# Patient Record
Sex: Female | Born: 1955
Health system: Southern US, Community
[De-identification: ages and names within clinical notes are randomized; demographics above are authoritative.]

## PROBLEM LIST (undated history)

## (undated) DIAGNOSIS — I10 Essential (primary) hypertension: Secondary | ICD-10-CM

## (undated) DIAGNOSIS — I119 Hypertensive heart disease without heart failure: Secondary | ICD-10-CM

## (undated) DIAGNOSIS — F419 Anxiety disorder, unspecified: Secondary | ICD-10-CM

## (undated) DIAGNOSIS — J410 Simple chronic bronchitis: Secondary | ICD-10-CM

## (undated) DIAGNOSIS — E785 Hyperlipidemia, unspecified: Secondary | ICD-10-CM

## (undated) DIAGNOSIS — I5189 Other ill-defined heart diseases: Secondary | ICD-10-CM

## (undated) DIAGNOSIS — R011 Cardiac murmur, unspecified: Secondary | ICD-10-CM

## (undated) DIAGNOSIS — E119 Type 2 diabetes mellitus without complications: Secondary | ICD-10-CM

## (undated) DIAGNOSIS — I4510 Unspecified right bundle-branch block: Secondary | ICD-10-CM

## (undated) DIAGNOSIS — I422 Other hypertrophic cardiomyopathy: Secondary | ICD-10-CM

## (undated) HISTORY — PX: TUBAL LIGATION: SHX77

## (undated) HISTORY — PX: CHOLECYSTECTOMY: SHX55

---

## 2008-07-22 ENCOUNTER — Ambulatory Visit: Payer: Self-pay | Admitting: Internal Medicine

## 2009-01-26 ENCOUNTER — Ambulatory Visit: Payer: Self-pay | Admitting: Internal Medicine

## 2009-06-11 ENCOUNTER — Ambulatory Visit: Payer: Self-pay | Admitting: Internal Medicine

## 2009-11-25 ENCOUNTER — Ambulatory Visit: Payer: Self-pay | Admitting: Family Medicine

## 2012-01-15 ENCOUNTER — Ambulatory Visit: Payer: Self-pay | Admitting: Family Medicine

## 2015-05-03 ENCOUNTER — Other Ambulatory Visit: Payer: Self-pay | Admitting: Family Medicine

## 2018-02-18 DIAGNOSIS — L089 Local infection of the skin and subcutaneous tissue, unspecified: Secondary | ICD-10-CM | POA: Diagnosis not present

## 2018-02-18 DIAGNOSIS — I8312 Varicose veins of left lower extremity with inflammation: Secondary | ICD-10-CM | POA: Diagnosis not present

## 2018-09-16 DIAGNOSIS — B354 Tinea corporis: Secondary | ICD-10-CM | POA: Diagnosis not present

## 2018-09-16 DIAGNOSIS — B351 Tinea unguium: Secondary | ICD-10-CM | POA: Diagnosis not present

## 2018-09-16 DIAGNOSIS — Z79899 Other long term (current) drug therapy: Secondary | ICD-10-CM | POA: Diagnosis not present

## 2018-09-17 DIAGNOSIS — M722 Plantar fascial fibromatosis: Secondary | ICD-10-CM | POA: Diagnosis not present

## 2019-02-13 DIAGNOSIS — R21 Rash and other nonspecific skin eruption: Secondary | ICD-10-CM | POA: Diagnosis not present

## 2019-02-13 DIAGNOSIS — I8311 Varicose veins of right lower extremity with inflammation: Secondary | ICD-10-CM | POA: Diagnosis not present

## 2019-03-22 ENCOUNTER — Encounter: Payer: Self-pay | Admitting: Gynecology

## 2019-03-22 ENCOUNTER — Ambulatory Visit
Admission: EM | Admit: 2019-03-22 | Discharge: 2019-03-22 | Disposition: A | Discharge status: Home / Self Care | Payer: 59 | Attending: Family Medicine | Admitting: Family Medicine

## 2019-03-22 ENCOUNTER — Other Ambulatory Visit: Payer: Self-pay

## 2019-03-22 ENCOUNTER — Ambulatory Visit (INDEPENDENT_AMBULATORY_CARE_PROVIDER_SITE_OTHER): Payer: 59

## 2019-03-22 DIAGNOSIS — M1712 Unilateral primary osteoarthritis, left knee: Secondary | ICD-10-CM | POA: Diagnosis not present

## 2019-03-22 DIAGNOSIS — M25562 Pain in left knee: Secondary | ICD-10-CM

## 2019-03-22 HISTORY — DX: Hyperlipidemia, unspecified: E78.5

## 2019-03-22 HISTORY — DX: Type 2 diabetes mellitus without complications: E11.9

## 2019-03-22 MED ORDER — MELOXICAM 15 MG PO TABS: 15.0000 mg | ORAL_TABLET | Freq: Every day | ORAL | 0 refills | Status: DC | PRN

## 2019-03-22 NOTE — ED Provider Notes (Signed)
[facility]    CSN: [id number] Arrival date & time: 03/22/19  1154  History   Chief Complaint Knee pain  HPI  63 year old female presents with left knee pain.  Patient reports a 2-week history of left knee pain.  No known inciting event.  Has been progressively getting worse.  Patient states that she fell on Friday after she tripped on a chair and fell onto gravel.  She states that her pain has been worse since then.  Pain currently 3/10 in severity.  Worse with ambulation and certain movements.  He has been icing and using heat without resolution.  No relieving factors.  Pain is located anteriorly as well as posteriorly.  She reports associated swelling.  No other associated symptoms.  No other complaints.  PMH, Surgical Hx, Family Hx, Social History reviewed and updated as below.  Past Medical History:  Diagnosis Date  . Diabetes mellitus without complication (HCC)   . Hyperlipemia   Obesity  Past Surgical History:  Procedure Laterality Date  . CHOLECYSTECTOMY    . TUBAL LIGATION      OB History   No obstetric history on file.    Home Medications    Prior to Admission medications   Medication Sig Start Date End Date Taking? Authorizing Provider  atorvastatin (LIPITOR) 40 MG tablet Take by mouth. 11/11/17  Yes [provider]  cetirizine (ZYRTEC) 10 MG tablet Take by mouth.   Yes [provider]  glucose blood (ONE TOUCH ULTRA TEST) test strip  09/13/14  Yes [provider]  metFORMIN (GLUCOPHAGE) 1000 MG tablet Take by mouth. 11/11/17  Yes [provider]  OneTouch Delica Lancets 33G MISC  09/13/14  Yes [provider]  aspirin 81 MG chewable tablet Chew by mouth. 06/02/10   [provider]  meloxicam (MOBIC) 15 MG tablet Take 1 tablet (15 mg total) by mouth daily as needed for pain. 03/22/19   Tommie Sams, DO    Family History Family History  Problem Relation Age of Onset  . Bone cancer Mother   .  Hypertension Father   . Gout Father     Social History Social History   Tobacco Use  . Smoking status: Current Every Day Smoker  . Smokeless tobacco: Never Used  Substance Use Topics  . Alcohol use: Yes  . Drug use: Never     Allergies   Aspartame; Codeine; and Other   Review of Systems Review of Systems  Constitutional: Negative.   Musculoskeletal:       Left knee pain.   Physical Exam Triage Vital Signs ED Triage Vitals  Enc Vitals Group     BP 03/22/19 1208 127/89     Pulse Rate 03/22/19 1208 90     Resp 03/22/19 1208 16     Temp 03/22/19 1208 98.7 F (37.1 C)     Temp Source 03/22/19 1208 Oral     SpO2 03/22/19 1208 98 %     Weight 03/22/19 1209 195 lb (88.5 kg)     Height 03/22/19 1209 5\' 2"  (1.575 m)     Head Circumference --      Peak Flow --      Pain Score 03/22/19 1209 3     Pain Loc --      Pain Edu? --      Excl. in GC? --    No data found.  Updated Vital Signs BP 127/89 (BP Location: Left Arm)   Pulse 90  Temp 98.7 F (37.1 C) (Oral)   Resp 16   Ht 5\' 2"  (1.575 m)   Wt 88.5 kg   SpO2 98%   BMI 35.67 kg/m   Visual Acuity Right Eye Distance:   Left Eye Distance:   Bilateral Distance:    Right Eye Near:   Left Eye Near:    Bilateral Near:     Physical Exam Vitals signs and nursing note reviewed.  Constitutional:      General: She is not in acute distress.    Appearance: Normal appearance. She is obese.  HENT:     Head: Normocephalic and atraumatic.  Eyes:     General:        Right eye: No discharge.        Left eye: No discharge.     Conjunctiva/sclera: Conjunctivae normal.  Cardiovascular:     Rate and Rhythm: Normal rate and regular rhythm.  Pulmonary:     Effort: Pulmonary effort is normal.     Breath sounds: Normal breath sounds.  Musculoskeletal:     Comments: Left knee  Inspection -swelling noted. Mild tenderness anteriorly. Range of motion decrease in extension. Ligaments intact. Patellar and quadriceps  tendons unremarkable.  Neurological:     Mental Status: She is alert.  Psychiatric:        Mood and Affect: Mood normal.        Behavior: Behavior normal.    UC Treatments / Results  Labs (all labs ordered are listed, but only abnormal results are displayed) Labs Reviewed - No data to display  EKG None  Radiology Dg Knee Complete 4 Views Left  Result Date: 03/22/2019 CLINICAL DATA:  Left knee pain for the past 2 weeks. No known injury. EXAM: LEFT KNEE - COMPLETE 4+ VIEW COMPARISON:  None. FINDINGS: Suspected small knee joint effusion. No fracture or dislocation. Mild tricompartmental degenerative change of the knee, likely worse within the medial compartment and patellar femoral joints with joint space loss, subchondral sclerosis and osteophytosis. No evidence of chondrocalcinosis. Presumed dermal calcification adjacent to the tibial tuberosity. Regional soft tissues appear normal. IMPRESSION: 1. No acute findings. 2. Mild tricompartmental degenerative change of the knee with associated small knee joint effusion. Electronically Signed   By: Simonne ComeJohn  Watts M.D.   On: 03/22/2019 12:48    Procedures Procedures (including critical care time)  Medications Ordered in UC Medications - No data to display  Initial Impression / Assessment and Plan / UC Course  I have reviewed the triage vital signs and the nursing notes.  Pertinent labs & imaging results that were available during my care of the patient were reviewed by me and considered in my medical decision making (see chart for details).    63 year old female presents with knee pain. Xray reveal Osteoarthritis and effusion. No fracture. Advised rest, ice, elevation. Mobic as directed.  Final Clinical Impressions(s) / UC Diagnoses   Final diagnoses:  Primary osteoarthritis of left knee  Acute pain of left knee     Discharge Instructions     Rest, ice, elevation.  Medication as prescribed.  If persists, see [facility] or [facility].  Take care  Dr. Adriana Simasook     ED Prescriptions    Medication Sig Dispense Auth. Provider   meloxicam (MOBIC) 15 MG tablet Take 1 tablet (15 mg total) by mouth daily as needed for pain. 30 tablet Tommie Samsook, Kaliya Shreiner G, DO     Controlled Substance Prescriptions [place] Controlled Substance Registry  consulted? Not Applicable   Tommie Sams, DO 03/22/19 1302

## 2019-03-22 NOTE — ED Triage Notes (Signed)
Pt. Stated x 2 weeks with left knee pain. Per patient pain getting worse. Per patient x 3 days trip and fell on left knee,

## 2019-03-22 NOTE — Discharge Instructions (Signed)
Rest, ice, elevation.  Medication as prescribed.  If persists, see [facility] or [facility].  Take care  Dr. Adriana Simas

## 2019-04-11 ENCOUNTER — Other Ambulatory Visit: Payer: Self-pay | Admitting: Family Medicine

## 2020-10-23 IMAGING — CR LEFT KNEE - COMPLETE 4+ VIEW
4 series · 4 of 4 positions shown · non-contrast
Comparison: None.

CLINICAL DATA: Left knee pain for the past 2 weeks. No known
injury.

EXAM:
LEFT KNEE - COMPLETE 4+ VIEW

[knee ap]
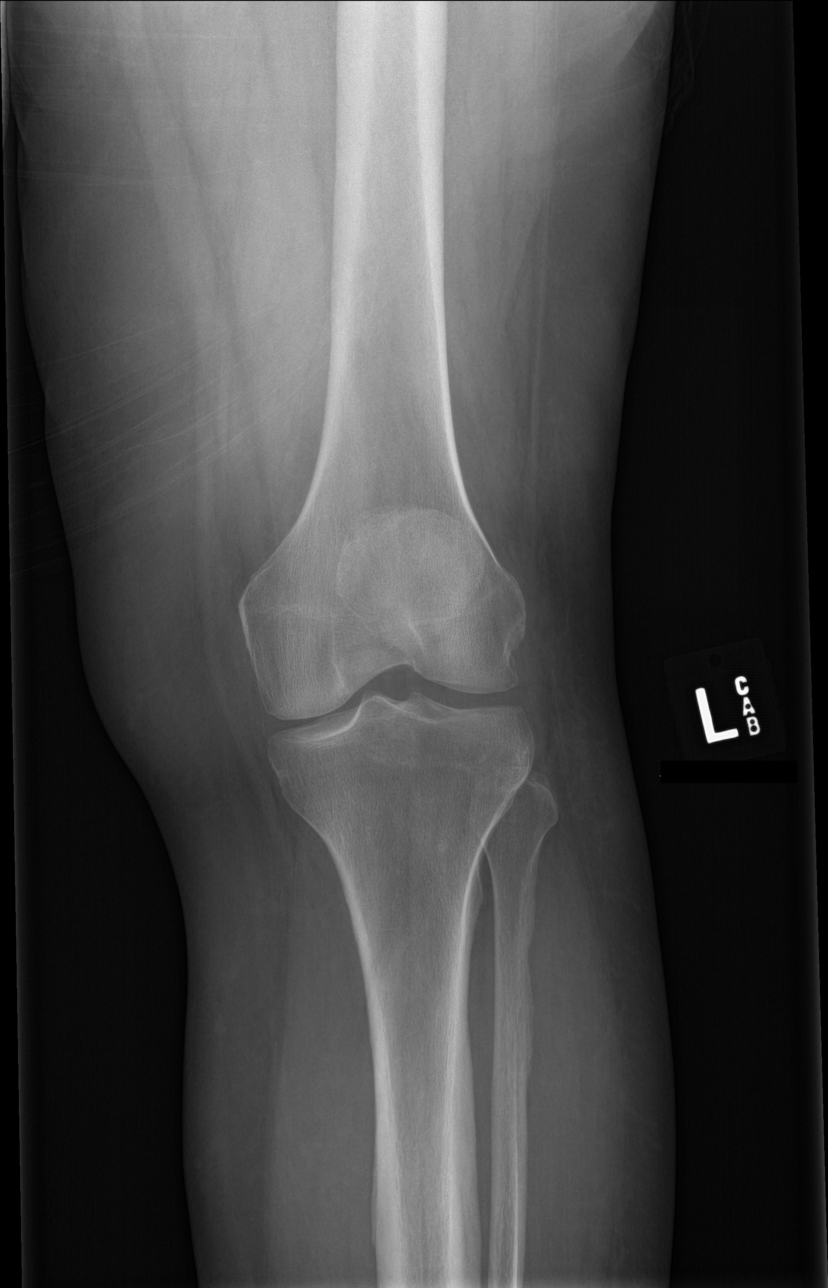

[knee lat]
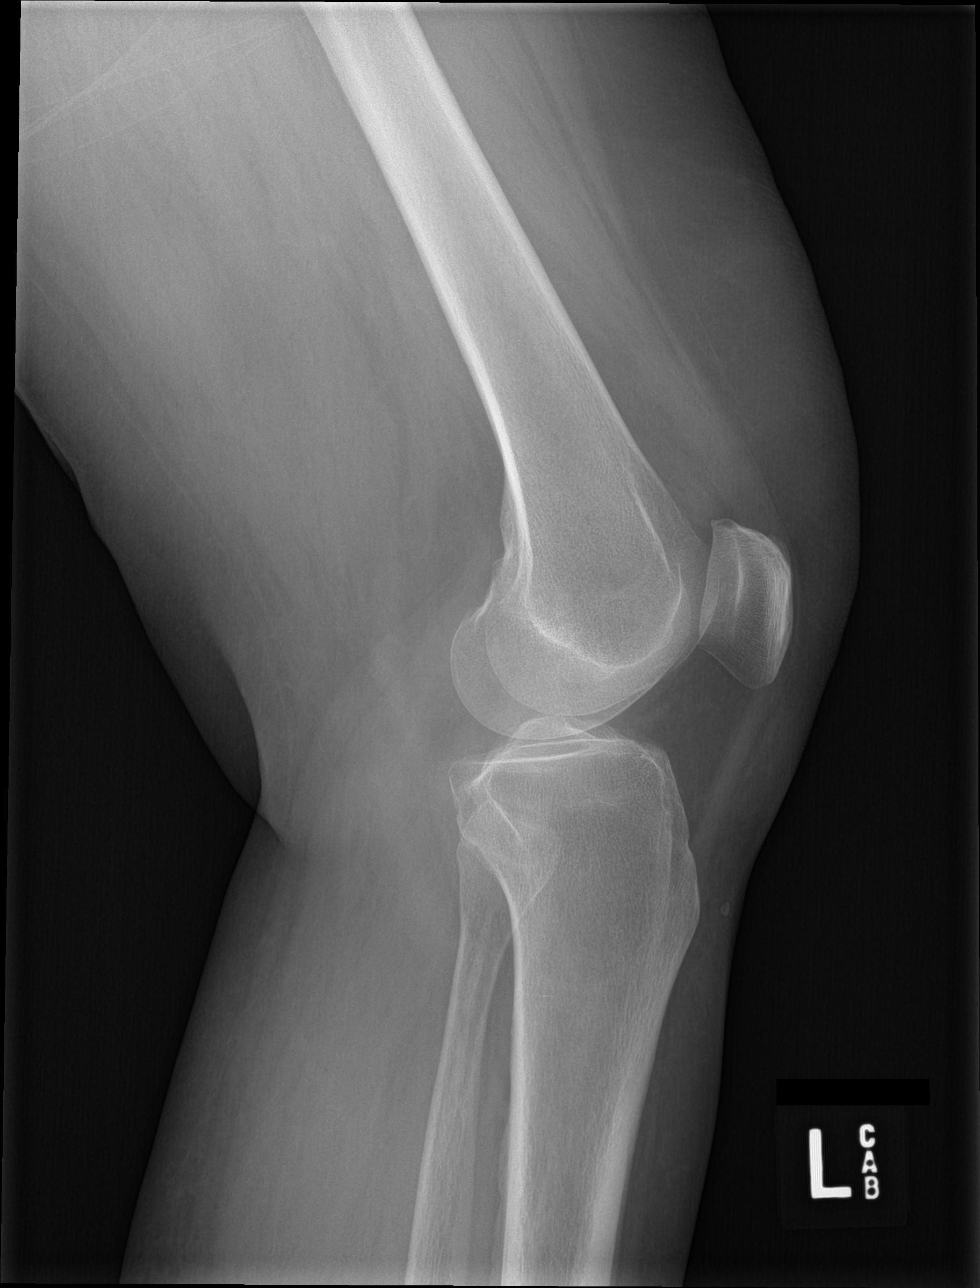

[tunnel]
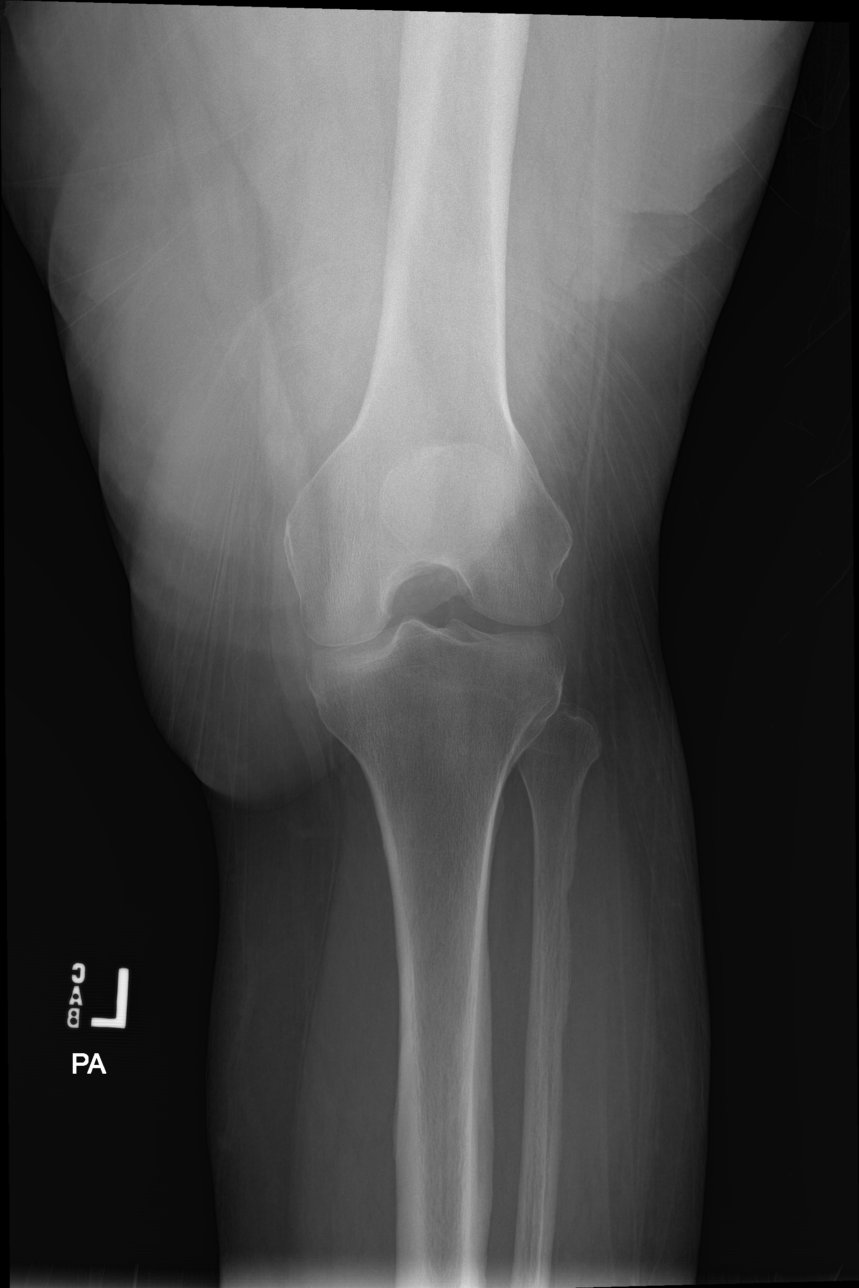

[patella skyline]
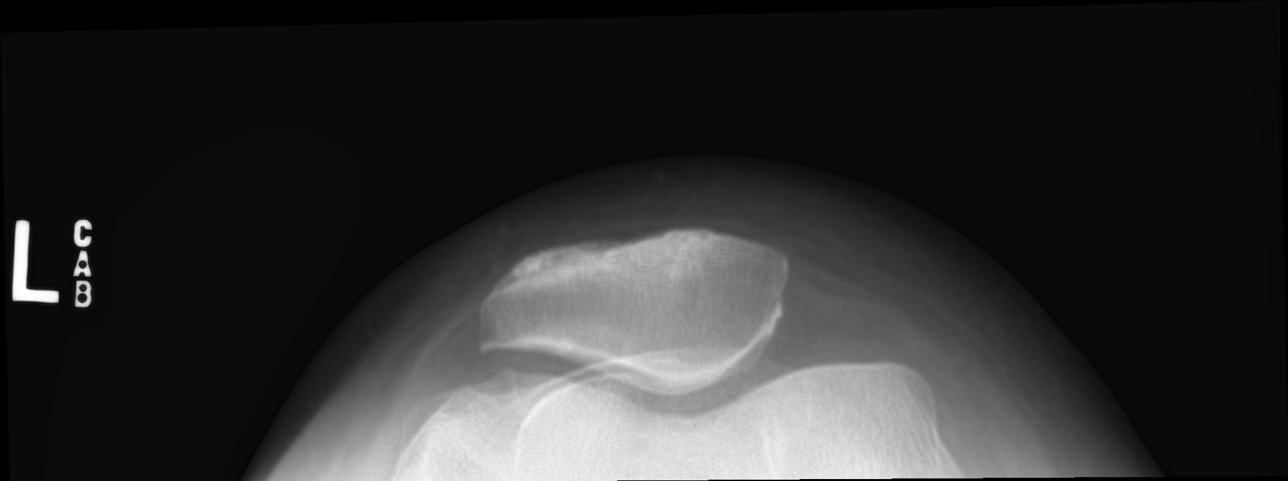

[4 of 4 positions shown; findings below may reference images not displayed]

FINDINGS: Suspected small knee joint effusion. No fracture or dislocation.
Mild tricompartmental degenerative change of the knee, likely worse
within the medial compartment and patellar femoral joints with joint
space loss, subchondral sclerosis and osteophytosis. No evidence of
chondrocalcinosis. Presumed dermal calcification adjacent to the
tibial tuberosity. Regional soft tissues appear normal.
IMPRESSION: 1. No acute findings.
2. Mild tricompartmental degenerative change of the knee with
associated small knee joint effusion.

## 2022-04-17 ENCOUNTER — Other Ambulatory Visit: Payer: Self-pay | Admitting: Internal Medicine

## 2022-04-17 DIAGNOSIS — I10 Essential (primary) hypertension: Secondary | ICD-10-CM

## 2022-04-17 DIAGNOSIS — R9431 Abnormal electrocardiogram [ECG] [EKG]: Secondary | ICD-10-CM

## 2024-01-20 ENCOUNTER — Ambulatory Visit: Payer: Medicare Other

## 2024-01-20 DIAGNOSIS — Z09 Encounter for follow-up examination after completed treatment for conditions other than malignant neoplasm: Secondary | ICD-10-CM | POA: Diagnosis not present

## 2024-01-20 DIAGNOSIS — K640 First degree hemorrhoids: Secondary | ICD-10-CM | POA: Diagnosis not present

## 2024-01-20 DIAGNOSIS — D123 Benign neoplasm of transverse colon: Secondary | ICD-10-CM | POA: Diagnosis not present

## 2024-01-20 DIAGNOSIS — K621 Rectal polyp: Secondary | ICD-10-CM | POA: Diagnosis not present

## 2024-01-20 DIAGNOSIS — D122 Benign neoplasm of ascending colon: Secondary | ICD-10-CM | POA: Diagnosis not present

## 2024-01-20 DIAGNOSIS — Z860101 Personal history of adenomatous and serrated colon polyps: Secondary | ICD-10-CM | POA: Diagnosis not present

## 2024-03-25 ENCOUNTER — Encounter: Payer: Self-pay | Admitting: Ophthalmology

## 2024-03-25 NOTE — Anesthesia Preprocedure Evaluation (Addendum)
 Anesthesia Evaluation    Airway        Dental   Pulmonary Current Smoker          Cardiovascular hypertension,   04-10-22  INTERPRETATION  NORMAL LEFT VENTRICULAR SYSTOLIC FUNCTION   WITH SEVERE LVH  NORMAL RIGHT VENTRICULAR SYSTOLIC FUNCTION  TRIVIAL REGURGITATION NOTED  NO VALVULAR STENOSIS  TRIVIAL MR, TR ,PR  GRADE I DIASTOLIC DYSFUNCTION Hypertrophic cardiomyopathy  EF >55%     Neuro/Psych    GI/Hepatic   Endo/Other  diabetes    Renal/GU      Musculoskeletal   Abdominal   Peds  Hematology   Anesthesia Other Findings Diabetes mellitus without complication Hyperlipemia Heart murmur Simple chronic bronchitis  Primary hypertension  Right bundle branch block (RBBB) LVH (left ventricular hypertrophy) due to hypertensive disease, without heart failure Hypertrophic cardiomyopathy  Grade I diastolic dysfunction     Reproductive/Obstetrics                              Anesthesia Physical Anesthesia Plan  ASA: 4  Anesthesia Plan:    Post-op Pain Management:    Induction:   PONV Risk Score and Plan:   Airway Management Planned:   Additional Equipment:   Intra-op Plan:   Post-operative Plan:   Informed Consent:   Plan Discussed with:   Anesthesia Plan Comments:          Anesthesia Quick Evaluation

## 2024-04-01 NOTE — Discharge Instructions (Signed)

## 2024-04-02 ENCOUNTER — Encounter: Payer: Self-pay | Admitting: Ophthalmology

## 2024-04-02 ENCOUNTER — Ambulatory Visit
Admission: RE | Admit: 2024-04-02 | Discharge: 2024-04-02 | Disposition: A | Discharge status: Home / Self Care | Attending: Ophthalmology | Admitting: Ophthalmology

## 2024-04-02 ENCOUNTER — Other Ambulatory Visit: Payer: Self-pay

## 2024-04-02 ENCOUNTER — Ambulatory Visit: Payer: Self-pay | Admitting: Anesthesiology

## 2024-04-02 ENCOUNTER — Encounter
Admission: RE | Disposition: A | Discharge status: Home / Self Care | Payer: Self-pay | Source: Home / Self Care | Attending: Ophthalmology

## 2024-04-02 DIAGNOSIS — Z7985 Long-term (current) use of injectable non-insulin antidiabetic drugs: Secondary | ICD-10-CM | POA: Insufficient documentation

## 2024-04-02 DIAGNOSIS — I10 Essential (primary) hypertension: Secondary | ICD-10-CM | POA: Insufficient documentation

## 2024-04-02 DIAGNOSIS — H2512 Age-related nuclear cataract, left eye: Secondary | ICD-10-CM | POA: Insufficient documentation

## 2024-04-02 DIAGNOSIS — I4510 Unspecified right bundle-branch block: Secondary | ICD-10-CM | POA: Diagnosis not present

## 2024-04-02 DIAGNOSIS — E1136 Type 2 diabetes mellitus with diabetic cataract: Secondary | ICD-10-CM | POA: Diagnosis not present

## 2024-04-02 DIAGNOSIS — H25042 Posterior subcapsular polar age-related cataract, left eye: Secondary | ICD-10-CM | POA: Diagnosis present

## 2024-04-02 DIAGNOSIS — F17210 Nicotine dependence, cigarettes, uncomplicated: Secondary | ICD-10-CM | POA: Diagnosis not present

## 2024-04-02 HISTORY — DX: Unspecified right bundle-branch block: I45.10

## 2024-04-02 HISTORY — DX: Simple chronic bronchitis: J41.0

## 2024-04-02 HISTORY — DX: Other ill-defined heart diseases: I51.89

## 2024-04-02 HISTORY — DX: Anxiety disorder, unspecified: F41.9

## 2024-04-02 HISTORY — DX: Other hypertrophic cardiomyopathy: I42.2

## 2024-04-02 HISTORY — DX: Hypertensive heart disease without heart failure: I11.9

## 2024-04-02 HISTORY — DX: Cardiac murmur, unspecified: R01.1

## 2024-04-02 HISTORY — PX: CATARACT EXTRACTION W/PHACO: SHX586

## 2024-04-02 HISTORY — DX: Essential (primary) hypertension: I10

## 2024-04-02 LAB — GLUCOSE, CAPILLARY: Glucose-Capillary: 189 mg/dL — ABNORMAL HIGH (ref 70–99)

## 2024-04-02 SURGERY — PHACOEMULSIFICATION, CATARACT, WITH IOL INSERTION
Anesthesia: Topical | Site: Eye | Laterality: Left

## 2024-04-02 MED ORDER — MOXIFLOXACIN HCL 0.5 % OP SOLN
OPHTHALMIC | Status: DC | PRN
  Administered 2024-04-02: .2 mL via OPHTHALMIC

## 2024-04-02 MED ORDER — SIGHTPATH DOSE#1 NA HYALUR & NA CHOND-NA HYALUR IO KIT
PACK | INTRAOCULAR | Status: DC | PRN
  Administered 2024-04-02: 1 via OPHTHALMIC

## 2024-04-02 MED ORDER — TETRACAINE HCL 0.5 % OP SOLN
1.0000 [drp] | OPHTHALMIC | Status: DC | PRN
  Administered 2024-04-02 (×3): 1 [drp] via OPHTHALMIC

## 2024-04-02 MED ORDER — ARMC OPHTHALMIC DILATING DROPS
OPHTHALMIC | Status: AC
  Filled 2024-04-02: qty 0.5

## 2024-04-02 MED ORDER — FENTANYL CITRATE (PF) 100 MCG/2ML IJ SOLN
INTRAMUSCULAR | Status: AC
  Filled 2024-04-02: qty 2

## 2024-04-02 MED ORDER — SIGHTPATH DOSE#1 BSS IO SOLN
INTRAOCULAR | Status: DC | PRN
  Administered 2024-04-02: 77 mL via OPHTHALMIC

## 2024-04-02 MED ORDER — TETRACAINE HCL 0.5 % OP SOLN
OPHTHALMIC | Status: AC
  Filled 2024-04-02: qty 4

## 2024-04-02 MED ORDER — BRIMONIDINE TARTRATE-TIMOLOL 0.2-0.5 % OP SOLN
OPHTHALMIC | Status: DC | PRN
  Administered 2024-04-02: 1 [drp] via OPHTHALMIC

## 2024-04-02 MED ORDER — ARMC OPHTHALMIC DILATING DROPS
1.0000 | OPHTHALMIC | Status: DC | PRN
  Administered 2024-04-02 (×3): 1 via OPHTHALMIC

## 2024-04-02 MED ORDER — FENTANYL CITRATE (PF) 100 MCG/2ML IJ SOLN
INTRAMUSCULAR | Status: DC | PRN
  Administered 2024-04-02: 50 ug via INTRAVENOUS

## 2024-04-02 MED ORDER — LIDOCAINE HCL (PF) 2 % IJ SOLN
INTRAOCULAR | Status: DC | PRN
  Administered 2024-04-02: 4 mL via INTRAOCULAR

## 2024-04-02 MED ORDER — MIDAZOLAM HCL 2 MG/2ML IJ SOLN
INTRAMUSCULAR | Status: AC
  Filled 2024-04-02: qty 2

## 2024-04-02 MED ORDER — MIDAZOLAM HCL 2 MG/2ML IJ SOLN
2.0000 mg | Freq: Once | INTRAMUSCULAR | Status: AC
  Administered 2024-04-02: 2 mg via INTRAVENOUS

## 2024-04-02 SURGICAL SUPPLY — 13 items
CATARACT SUITE SIGHTPATH (MISCELLANEOUS) ×1 IMPLANT
DISSECTOR HYDRO NUCLEUS 50X22 (MISCELLANEOUS) ×1 IMPLANT
DRSG TEGADERM 2-3/8X2-3/4 SM (GAUZE/BANDAGES/DRESSINGS) ×1 IMPLANT
FEE CATARACT SUITE SIGHTPATH (MISCELLANEOUS) ×1 IMPLANT
GLOVE BIOGEL PI IND STRL 8 (GLOVE) ×1 IMPLANT
GLOVE SURG LX STRL 7.5 STRW (GLOVE) ×1 IMPLANT
GLOVE SURG PROTEXIS BL SZ6.5 (GLOVE) ×1 IMPLANT
GLOVE SURG SYN 6.5 PF PI BL (GLOVE) ×1 IMPLANT
LENS CLAREON WAGON WHEEL 22.5 (Intraocular Lens) ×1 IMPLANT
LENS IOL CLRN WGN WHL 22.5 (Intraocular Lens) IMPLANT
NDL FILTER BLUNT 18X1 1/2 (NEEDLE) ×1 IMPLANT
NEEDLE FILTER BLUNT 18X1 1/2 (NEEDLE) ×1 IMPLANT
SYR 3ML LL SCALE MARK (SYRINGE) ×1 IMPLANT

## 2024-04-02 NOTE — H&P (Signed)
 [facility]   Primary Care Physician:  Madaline Scales, PA-C Ophthalmologist: Dr. Meg Spina  Pre-Procedure History & Physical: HPI:  Erica Mccoy is a 68 y.o. female here for cataract surgery.   Past Medical History:  Diagnosis Date   Diabetes mellitus without complication (HCC)    Grade I diastolic dysfunction    Heart murmur    Hyperlipemia    Hypertrophic cardiomyopathy (HCC)    LVH (left ventricular hypertrophy) due to hypertensive disease, without heart failure    Primary hypertension    Right bundle branch block (RBBB)    Simple chronic bronchitis (HCC)     Past Surgical History:  Procedure Laterality Date   CHOLECYSTECTOMY     TUBAL LIGATION      Prior to Admission medications   Medication Sig Start Date End Date Taking? Authorizing Provider  atorvastatin (LIPITOR) 40 MG tablet Take by mouth. 11/11/17  Yes [provider]  cetirizine (ZYRTEC) 10 MG tablet Take by mouth.   Yes [provider]  OVER THE COUNTER MEDICATION daily. [place]   Yes [provider]  tirzepatide [facility]) 15 MG/0.5ML Pen Inject 15 mg into the skin once a week. Saturday   Yes [provider]  aspirin 81 MG chewable tablet Chew by mouth. 06/02/10   [provider]  glucose blood (ONE TOUCH ULTRA TEST) test strip  09/13/14   [provider]  ipratropium (ATROVENT) 0.06 % nasal spray Place 2 sprays into both nostrils 3 (three) times daily. Patient not taking: Reported on 03/25/2024    [provider]  OneTouch Delica Lancets 33G MISC  09/13/14   [provider]  predniSONE (DELTASONE) 20 MG tablet Take 20 mg by mouth in the morning and at bedtime. For 7 days Patient not taking: Reported on 03/25/2024    [provider]    Allergies as of 02/19/2024 - Unable to Assess 03/22/2019  Allergen Reaction Noted   Aspartame Dermatitis and Itching 11/20/2011   Codeine Nausea And Vomiting 03/22/2019   Other Other (See  Comments) 09/20/2014    Family History  Problem Relation Age of Onset   Bone cancer Mother    Hypertension Father    Gout Father     Social History   Socioeconomic History   Marital status: Married    Spouse name: Not on file   Number of children: Not on file   Years of education: Not on file   Highest education level: Not on file  Occupational History   Not on file  Tobacco Use   Smoking status: Every Day    Current packs/day: 0.50    Average packs/day: 0.5 packs/day for 50.3 years (25.1 ttl pk-yrs)    Types: Cigarettes    Start date: 1975   Smokeless tobacco: Never  Vaping Use   Vaping status: Never Used  Substance and Sexual Activity   Alcohol use: Yes   Drug use: Never   Sexual activity: Not on file  Other Topics Concern   Not on file  Social History Narrative   Not on file   Social Drivers of Health   Financial Resource Strain: Low Risk  (04/10/2023)   Received from [facility]   Overall Financial Resource Strain (CARDIA)    Difficulty of Paying Living Expenses: Not very hard  Food Insecurity: No Food Insecurity (04/10/2023)   Received from [facility]   Hunger Vital Sign    Worried About Running Out of Food in  the Last Year: Never true    Ran Out of Food in the Last Year: Never true  Transportation Needs: No Transportation Needs (04/10/2023)   Received from [facility] - Transportation    In the past 12 months, has lack of transportation kept you from medical appointments or from getting medications?: No    Lack of Transportation (Non-Medical): No  Physical Activity: Not on file  Stress: Not on file  Social Connections: Not on file  Intimate Partner Violence: Not on file    Review of Systems: See HPI, otherwise negative ROS  Physical Exam: Ht 5\' 2"  (1.575 m)   Wt 66.7 kg   BMI 26.89 kg/m  General:   Alert, cooperative in NAD Head:  Normocephalic and atraumatic. Respiratory:  Normal  work of breathing. Cardiovascular:  RRR  Impression/Plan: Erica Mccoy is here for cataract surgery.  Risks, benefits, limitations, and alternatives regarding cataract surgery have been reviewed with the patient.  Questions have been answered.  All parties agreeable.   Trudi Fus, MD  04/02/2024, 7:20 AM

## 2024-04-02 NOTE — Transfer of Care (Signed)
 Immediate Anesthesia Transfer of Care Note  Patient: Erica Mccoy  Procedure(s) Performed: PHACOEMULSIFICATION, CATARACT, WITH IOL INSERTION 6.45 00:53.4 (Left: Eye)  Patient Location: PACU  Anesthesia Type: No value filed.  Level of Consciousness: awake, alert  and patient cooperative  Airway and Oxygen Therapy: Patient Spontanous Breathing and Patient connected to supplemental oxygen  Post-op Assessment: Post-op Vital signs reviewed, Patient's Cardiovascular Status Stable, Respiratory Function Stable, Patent Airway and No signs of Nausea or vomiting  Post-op Vital Signs: Reviewed and stable  Complications: No notable events documented.

## 2024-04-02 NOTE — Op Note (Signed)
 OPERATIVE NOTE  CARREN BLAKLEY [id number] 04/02/2024   PREOPERATIVE DIAGNOSIS: Nuclear sclerotic cataract left eye. H25.12   POSTOPERATIVE DIAGNOSIS: Nuclear sclerotic cataract left eye. H25.12   PROCEDURE:  Phacoemusification with posterior chamber intraocular lens placement of the left eye  Ultrasound time: Procedure(s): PHACOEMULSIFICATION, CATARACT, WITH IOL INSERTION 6.45 00:53.4 (Left)  LENS:   Implant Name Type Inv. Item Serial No. Manufacturer Lot No. LRB No. Used Action  LENS CLAREON WAGON WHEEL 22.5 - [id number] Intraocular Lens LENS CLAREON WAGON WHEEL 22.5 [id number] [facility]  Left 1 Implanted      SURGEON:  Rosy Cooper. Donalda Fruit, MD   ANESTHESIA:  Topical with tetracaine drops, augmented with 1% preservative-free intracameral lidocaine.   COMPLICATIONS:  None.   DESCRIPTION OF PROCEDURE:  The patient was identified in the holding room and transported to the operating room and placed in the supine position under the operating microscope.  The left eye was identified as the operative eye, which was prepped and draped in the usual sterile ophthalmic fashion.   A 1 millimeter clear-corneal paracentesis was made inferotemporally. Preservative-free 1% lidocaine mixed with 1:1,000 bisulfite-free aqueous solution of epinephrine was injected into the anterior chamber. The anterior chamber was then filled with Viscoat viscoelastic. A 2.4 millimeter keratome was used to make a clear-corneal incision superotemporally. A curvilinear capsulorrhexis was made with a cystotome and capsulorrhexis forceps. Balanced salt solution was used to hydrodissect and hydrodelineate the nucleus. Phacoemulsification was then used to remove the lens nucleus and epinucleus. The remaining cortex was then removed using the irrigation and aspiration handpiece. Provisc was then placed into the capsular bag to distend it for lens placement. A +22.50 D [id number] intraocular lens was then injected into the capsular bag. The  remaining viscoelastic was aspirated.   Wounds were hydrated with balanced salt solution.  The anterior chamber was inflated to a physiologic pressure with balanced salt solution.  No wound leaks were noted. Moxifloxacin was injected intracamerally.  Timolol and Brimonidine drops were applied to the eye.  The patient was taken to the recovery room in stable condition without complications of anesthesia or surgery.  Meryl Acosta Delphos 04/02/2024, 10:41 AM

## 2024-04-02 NOTE — Anesthesia Postprocedure Evaluation (Signed)
 Anesthesia Post Note  Patient: Erica Mccoy  Procedure(s) Performed: PHACOEMULSIFICATION, CATARACT, WITH IOL INSERTION 6.45 00:53.4 (Left: Eye)  Patient location during evaluation: PACU Anesthesia Type: MAC Level of consciousness: awake and alert Pain management: pain level controlled Vital Signs Assessment: post-procedure vital signs reviewed and stable Respiratory status: spontaneous breathing, nonlabored ventilation, respiratory function stable and patient connected to nasal cannula oxygen Cardiovascular status: stable and blood pressure returned to baseline Postop Assessment: no apparent nausea or vomiting Anesthetic complications: no   No notable events documented.   Last Vitals:  Vitals:   04/02/24 1043 04/02/24 1049  BP: 126/71 128/76  Pulse: 77 78  Resp: 10 16  Temp: (!) 36 C (!) 36 C  SpO2: 100% 100%    Last Pain:  Vitals:   04/02/24 1049  TempSrc:   PainSc: 0-No pain                 Latonya Knight C Jrake Rodriquez

## 2024-07-22 ENCOUNTER — Other Ambulatory Visit: Payer: Self-pay | Admitting: Cardiology

## 2024-07-22 DIAGNOSIS — I422 Other hypertrophic cardiomyopathy: Secondary | ICD-10-CM

## 2024-08-26 ENCOUNTER — Ambulatory Visit

## 2024-10-28 NOTE — Progress Notes (Signed)
 Established Patient Visit   Chief Complaint: Hypertrophic cardiomyopathy Date of Service: 10/28/2024 Date of Birth: [birth year] PCP: Erica Damien Dragon, PA  History of Present Illness: [name] is a 68 y.o.female patient who presented for hypertrophic cardiomyopathy  Past medical history significant for severe asymmetric septal hypertrophy-HCM, hyperlipidemia, diabetes type 2, right bundle branch block.  Cardiac MRI 10/2024 with severe asymmetric upper septal hypertrophy, wall thickness 2.4 cm with flow acceleration in LVOT without significant LVOT obstruction at rest.  Mild to moderate MR.  There is subendocardial enhancement involving mid to distal anterior wall, apex, distal lateral/inferior wall consistent with subendocardial myocardial ischemia in LAD territory with most of the territory being viable.  She works as an advertising account planner.  Does usual household activities with no complaints of chest pain/pressure, shortness of breath, palpitations, significant dizziness or syncope. No history of syncope, no family history of sudden cardiac death.  Past Medical and Surgical History  Past Medical History Past Medical History:  Diagnosis Date  . Allergic state 12/17/1978   codeine, aspartame  . Anxiety   . Bundle branch block, right 10/15/2014  . Colon polyps 01/27/2015   Sessile serrated adenoma, tubular adenoma and hyperplastic polyp  . COVID-19 08/2021  . Depression   . Diabetes mellitus type 2, uncomplicated (CMS/HHS-HCC)   . Diverticulosis 01/27/2015   sigmoid colon and in the ascending colon  . Hyperlipidemia 12/17/2013  . Hypertension 12/17/2013    Past Surgical History She has a past surgical history that includes Cholecystectomy (10/19/2014); Tubal ligation (12/17/1982); Colonoscopy (N/A, 01/27/2015); Colon @ [facility] (01/20/2024); and Cataract extraction.   Medications and Allergies  Current Medications  Current Outpatient Medications  Medication Sig Dispense Refill  . blood  glucose diagnostic test strip 1 each (1 strip total) 3 (three) times daily Use as instructed. 100 each 12  . blood glucose meter kit as directed 1 each 0  . MOUNJARO 15 mg/0.5 mL pen injector INJECT THE CONTENTS OF ONE PEN  SUBCUTANEOUSLY WEEKLY AS  DIRECTED 6 mL 3  . VITAMIN D2 1,250 mcg (50,000 unit) capsule TAKE 1 CAPSULE BY MOUTH EVERY 7  DAYS 13 capsule 3  . aspirin 81 MG EC tablet Take 1 tablet (81 mg total) by mouth once daily    . rosuvastatin (CRESTOR) 10 MG tablet Take 1 tablet (10 mg total) by mouth once daily 30 tablet 11   No current facility-administered medications for this visit.    Allergies: Aspartame, Atrovent [ipratropium bromide], Codeine, and Other  Social and Family History  Social History  reports that she has been smoking cigarettes. She has a 17.5 pack-year smoking history. She has never used smokeless tobacco. She reports current alcohol use of about 8.0 standard drinks of alcohol per week. She reports that she does not use drugs.  Family History Family History  Problem Relation Name Age of Onset  . Colon polyps Mother    . Bone cancer Mother    . Coronary Artery Disease (Blocked arteries around heart) Father         Had stents put in  . High blood pressure (Hypertension) Father    . Bipolar disorder Sister    . Bipolar disorder Sister Sharlet LITTIE Ring   . Colon polyps Sister Sharlet LITTIE Ring   . Obesity Sister Sharlet LITTIE Ring   . Depression Sister Randine LITTIE Bunnell   . Diabetes type II Sister Randine LITTIE Bunnell   . Obesity Sister Randine LITTIE Bunnell   . Bipolar disorder Brother    .  Bipolar disorder Brother Norleen ORN Little   . Diabetes type II Brother John W Little   . Obesity Brother Norleen ORN LIttle   . Diabetes type II Paternal Grandmother    . Diabetes type II Paternal Grandfather Uhl H Little   . Obesity Son ZURISADAI HELMINIAK     Review of Systems   Review of Systems: The patient denies chest pain, shortness of breath, orthopnea, paroxysmal nocturnal dyspnea, pedal  edema, palpitations, heart racing, presyncope, syncope.   Physical Examination   Vitals:BP 120/72   Pulse 97   Resp 16   Ht 157.5 cm (5' 2)   Wt 63 kg (139 lb)   SpO2 97%   BMI 25.42 kg/m  Ht:157.5 cm (5' 2) Wt:63 kg (139 lb) ADJ:Anib surface area is 1.66 meters squared. Body mass index is 25.42 kg/m.  HEENT: Pupils equally reactive to light and accomodation  Neck: Supple, no significant JVD Lungs: clear to auscultation bilaterally; no wheezes, rales, rhonchi Heart: Regular rate and rhythm. Grade 2/6 systolic murmur aortic area Extremities: no pedal edema  Assessment and Plan   68 y.o. female with  Hypertrophic cardiomyopathy LAD territory infarct on CMR Mild to moderate MR Right bundle branch block Hyperlipidemia Type 2 diabetes  Stable from cardiac standpoint. Recent cardiac with findings suggestive of hypertrophic cardiomyopathy. Will have 3-day cardiac monitor to look for any significant ventricular arrhythmia. Patient has no history of syncope, no family history of sudden cardiac death.   She is not symptomatic, no medical therapy is indicated at this time She is not interested to follow-up at [facility] clinic/genetic testing.  Recommend screening family members.  We have recommended EKG and echocardiographic screening for immediate family members at age-appropriate intervals (every 1-2 years for family members age 89-20, and every 5 years for adult immediate family members).   Cardiac MRI showed prior LAD territory infarct with more than 50 to 60% viability in that area.  She has multiple CAD risk factors.  Will proceed with left heart catheterization for further management.  Indication/risk/benefit discussed and she is agreeable to proceed. Low-dose aspirin 81 mg daily Had issues with atorvastatin, will start Crestor 10 mg daily  Orders Placed This Encounter  Procedures  . CARD holter monitoring from 3 to 7 days - hook up    Return in about 4 weeks (around  11/25/2024).  KRISHNA CHAITANYA ALLURI, MD  This dictation was prepared with Mccoy dictation. Any transcription errors that result from this process are unintentional.

## 2024-11-18 ENCOUNTER — Ambulatory Visit: Admit: 2024-11-18

## 2024-11-18 DIAGNOSIS — I2510 Atherosclerotic heart disease of native coronary artery without angina pectoris: Secondary | ICD-10-CM

## 2024-11-18 SURGERY — LEFT HEART CATH AND CORONARY ANGIOGRAPHY
Anesthesia: Moderate Sedation | Laterality: Left

## 2024-12-30 ENCOUNTER — Encounter: Admission: RE | Disposition: A | Payer: Self-pay | Source: Home / Self Care | Attending: Internal Medicine

## 2024-12-30 ENCOUNTER — Other Ambulatory Visit: Payer: Self-pay

## 2024-12-30 ENCOUNTER — Ambulatory Visit
Admission: RE | Admit: 2024-12-30 | Discharge: 2024-12-30 | Disposition: A | Discharge status: Home / Self Care | Attending: Internal Medicine | Admitting: Internal Medicine

## 2024-12-30 ENCOUNTER — Encounter: Payer: Self-pay | Admitting: Internal Medicine

## 2024-12-30 DIAGNOSIS — E119 Type 2 diabetes mellitus without complications: Secondary | ICD-10-CM | POA: Diagnosis not present

## 2024-12-30 DIAGNOSIS — I2510 Atherosclerotic heart disease of native coronary artery without angina pectoris: Secondary | ICD-10-CM | POA: Insufficient documentation

## 2024-12-30 DIAGNOSIS — Z7985 Long-term (current) use of injectable non-insulin antidiabetic drugs: Secondary | ICD-10-CM | POA: Diagnosis not present

## 2024-12-30 DIAGNOSIS — I4510 Unspecified right bundle-branch block: Secondary | ICD-10-CM | POA: Diagnosis not present

## 2024-12-30 DIAGNOSIS — Z79899 Other long term (current) drug therapy: Secondary | ICD-10-CM | POA: Diagnosis not present

## 2024-12-30 DIAGNOSIS — E785 Hyperlipidemia, unspecified: Secondary | ICD-10-CM | POA: Diagnosis not present

## 2024-12-30 DIAGNOSIS — Z7982 Long term (current) use of aspirin: Secondary | ICD-10-CM | POA: Insufficient documentation

## 2024-12-30 DIAGNOSIS — F17210 Nicotine dependence, cigarettes, uncomplicated: Secondary | ICD-10-CM | POA: Insufficient documentation

## 2024-12-30 DIAGNOSIS — I340 Nonrheumatic mitral (valve) insufficiency: Secondary | ICD-10-CM | POA: Insufficient documentation

## 2024-12-30 HISTORY — PX: LEFT HEART CATH AND CORONARY ANGIOGRAPHY: CATH118249

## 2024-12-30 LAB — GLUCOSE, CAPILLARY: Glucose-Capillary: 217 mg/dL — ABNORMAL HIGH (ref 70–99)

## 2024-12-30 MED ORDER — HEPARIN SODIUM (PORCINE) 1000 UNIT/ML IJ SOLN
INTRAMUSCULAR | Status: AC
  Filled 2024-12-30: qty 10

## 2024-12-30 MED ORDER — FREE WATER
500.0000 mL | Freq: Once | Status: AC
  Administered 2024-12-30: 500 mL via ORAL

## 2024-12-30 MED ORDER — HEPARIN (PORCINE) IN NACL 1000-0.9 UT/500ML-% IV SOLN
INTRAVENOUS | Status: AC
  Filled 2024-12-30: qty 1000

## 2024-12-30 MED ORDER — HEPARIN (PORCINE) IN NACL 2000-0.9 UNIT/L-% IV SOLN
INTRAVENOUS | Status: DC | PRN
  Administered 2024-12-30: 1000 mL

## 2024-12-30 MED ORDER — LIDOCAINE HCL (PF) 1 % IJ SOLN
INTRAMUSCULAR | Status: DC | PRN
  Administered 2024-12-30: 2 mL

## 2024-12-30 MED ORDER — MIDAZOLAM HCL 2 MG/2ML IJ SOLN
INTRAMUSCULAR | Status: AC
  Filled 2024-12-30: qty 2

## 2024-12-30 MED ORDER — SODIUM CHLORIDE 0.9% FLUSH: 3.0000 mL | Freq: Two times a day (BID) | INTRAVENOUS | Status: DC

## 2024-12-30 MED ORDER — FENTANYL CITRATE (PF) 100 MCG/2ML IJ SOLN
INTRAMUSCULAR | Status: DC | PRN
  Administered 2024-12-30: 25 ug via INTRAVENOUS

## 2024-12-30 MED ORDER — IOHEXOL 300 MG/ML  SOLN
INTRAMUSCULAR | Status: DC | PRN
  Administered 2024-12-30: 68 mL

## 2024-12-30 MED ORDER — MIDAZOLAM HCL (PF) 2 MG/2ML IJ SOLN
INTRAMUSCULAR | Status: DC | PRN
  Administered 2024-12-30: 1 mg via INTRAVENOUS

## 2024-12-30 MED ORDER — ROSUVASTATIN CALCIUM 40 MG PO TABS: 40.0000 mg | ORAL_TABLET | Freq: Every day | ORAL | 0 refills | Status: AC

## 2024-12-30 MED ORDER — VERAPAMIL HCL 2.5 MG/ML IV SOLN
INTRAVENOUS | Status: DC | PRN
  Administered 2024-12-30: 2.5 mg via INTRAVENOUS

## 2024-12-30 MED ORDER — SODIUM CHLORIDE 0.9% FLUSH: 3.0000 mL | INTRAVENOUS | Status: DC | PRN

## 2024-12-30 MED ORDER — ASPIRIN 81 MG PO CHEW: 81.0000 mg | CHEWABLE_TABLET | ORAL | Status: DC

## 2024-12-30 MED ORDER — SODIUM CHLORIDE 0.9 % IV SOLN: 250.0000 mL | INTRAVENOUS | Status: DC | PRN

## 2024-12-30 MED ORDER — HEPARIN SODIUM (PORCINE) 1000 UNIT/ML IJ SOLN
INTRAMUSCULAR | Status: DC | PRN
  Administered 2024-12-30: 3000 [IU] via INTRAVENOUS

## 2024-12-30 MED ORDER — VERAPAMIL HCL 2.5 MG/ML IV SOLN
INTRAVENOUS | Status: AC
  Filled 2024-12-30: qty 2

## 2024-12-30 MED ORDER — LIDOCAINE HCL 1 % IJ SOLN
INTRAMUSCULAR | Status: AC
  Filled 2024-12-30: qty 20

## 2024-12-30 MED ORDER — FENTANYL CITRATE (PF) 100 MCG/2ML IJ SOLN
INTRAMUSCULAR | Status: AC
  Filled 2024-12-30: qty 2

## 2025-01-01 ENCOUNTER — Encounter: Payer: Self-pay | Admitting: Internal Medicine

## 2025-01-01 LAB — CARDIAC CATHETERIZATION: Cath EF Quantitative: 75 %
